# Patient Record
Sex: Male | Born: 1980 | Race: White | Hispanic: No | Marital: Single | Smoking: Current every day smoker
Health system: Southern US, Community
[De-identification: ages and names within clinical notes are randomized; demographics above are authoritative.]

## PROBLEM LIST (undated history)

## (undated) HISTORY — PX: KNEE SURGERY: SHX244

---

## 2014-06-07 ENCOUNTER — Emergency Department (HOSPITAL_COMMUNITY): Payer: 59

## 2014-06-07 ENCOUNTER — Emergency Department (HOSPITAL_COMMUNITY)
Admission: EM | Admit: 2014-06-07 | Discharge: 2014-06-07 | Disposition: A | Discharge status: Home / Self Care | Payer: 59 | Attending: Emergency Medicine | Admitting: Emergency Medicine

## 2014-06-07 ENCOUNTER — Encounter (HOSPITAL_COMMUNITY): Payer: Self-pay | Admitting: Emergency Medicine

## 2014-06-07 DIAGNOSIS — S59919A Unspecified injury of unspecified forearm, initial encounter: Secondary | ICD-10-CM

## 2014-06-07 DIAGNOSIS — S6990XA Unspecified injury of unspecified wrist, hand and finger(s), initial encounter: Secondary | ICD-10-CM | POA: Diagnosis present

## 2014-06-07 DIAGNOSIS — S63509A Unspecified sprain of unspecified wrist, initial encounter: Secondary | ICD-10-CM | POA: Insufficient documentation

## 2014-06-07 DIAGNOSIS — Y9389 Activity, other specified: Secondary | ICD-10-CM | POA: Insufficient documentation

## 2014-06-07 DIAGNOSIS — Y9289 Other specified places as the place of occurrence of the external cause: Secondary | ICD-10-CM | POA: Diagnosis not present

## 2014-06-07 DIAGNOSIS — X500XXA Overexertion from strenuous movement or load, initial encounter: Secondary | ICD-10-CM | POA: Diagnosis not present

## 2014-06-07 DIAGNOSIS — S59909A Unspecified injury of unspecified elbow, initial encounter: Secondary | ICD-10-CM | POA: Diagnosis present

## 2014-06-07 DIAGNOSIS — S63502A Unspecified sprain of left wrist, initial encounter: Secondary | ICD-10-CM

## 2014-06-07 DIAGNOSIS — F17200 Nicotine dependence, unspecified, uncomplicated: Secondary | ICD-10-CM | POA: Insufficient documentation

## 2014-06-07 MED ORDER — NAPROXEN 500 MG PO TABS: 500.0000 mg | ORAL_TABLET | Freq: Two times a day (BID) | ORAL | Status: AC

## 2014-06-07 MED ORDER — IBUPROFEN 400 MG PO TABS
400.0000 mg | ORAL_TABLET | Freq: Once | ORAL | Status: AC
  Administered 2014-06-07: 400 mg via ORAL
  Filled 2014-06-07: qty 1

## 2014-06-07 NOTE — ED Notes (Signed)
Wrist splint applied to left wrist.

## 2014-06-07 NOTE — ED Provider Notes (Signed)
CSN: [id number]     Arrival date & time 06/07/14  1745 History  This chart was scribed for non-physician practitioner, Marlon Pel, PA-C working with Geoffery Lyons, MD by Luisa Dago, ED scribe. This patient was seen in room TR08C/TR08C and the patient's care was started at 7:50 PM.    Chief Complaint  Patient presents with  . Wrist Pain   The history is provided by the patient. No language interpreter was used.   HPI Comments: Antonio Norton is a 33 y.o. male who presents to the Emergency Department complaining of worsening left wrist pain that started 3 weeks ago. Pt states that the pain started after a fall that occurred 3 weeks ago. He states that he tried to catch his fall with the affected hand. Pt states that the pain is exacerbated by movement. Mr.Sepulveda reports making a wood splint for his affected hand and taking OTC medication for the associated pain. Denies any fever, chills, nausea, emesis, abdominal pain, numbness, weakness, tingling, or chest pain.   History reviewed. No pertinent past medical history. Past Surgical History  Procedure Laterality Date  . Knee surgery     No family history on file. History  Substance Use Topics  . Smoking status: Current Every Day Smoker  . Smokeless tobacco: Not on file  . Alcohol Use: Yes    Review of Systems  Constitutional: Negative for fever.  Musculoskeletal: Positive for arthralgias and joint swelling. Negative for myalgias.  Neurological: Negative for weakness and numbness.  All other systems reviewed and are negative.  Allergies  Review of patient's allergies indicates no known allergies.  Home Medications   Prior to Admission medications   Medication Sig Start Date End Date Taking? Authorizing Provider  naproxen (NAPROSYN) 500 MG tablet Take 1 tablet (500 mg total) by mouth 2 (two) times daily. 06/07/14   Dorthula Matas, PA-C    Triage vitals:BP 151/90  Pulse 107  Temp(Src) 97.8 F (36.6 C) (Oral)  Resp 18  Wt  285 lb (129.275 kg)  SpO2 100%  Physical Exam  Nursing note and vitals reviewed. Constitutional: He is oriented to person, place, and time. He appears well-developed and well-nourished. No distress.  HENT:  Head: Normocephalic and atraumatic.  Eyes: Conjunctivae and EOM are normal.  Neck: Normal range of motion. Neck supple.  Cardiovascular: Normal rate.   Pulmonary/Chest: Effort normal. No respiratory distress.  Musculoskeletal: Normal range of motion.       Left wrist: He exhibits tenderness and bony tenderness. He exhibits normal range of motion, no swelling, no effusion, no crepitus, no deformity and no laceration.  Neurological: He is alert and oriented to person, place, and time.  Skin: Skin is warm and dry.  Psychiatric: He has a normal mood and affect. His behavior is normal.    ED Course  Procedures (including critical care time)  DIAGNOSTIC STUDIES: Oxygen Saturation is 100% on RA, normal by my interpretation.    COORDINATION OF CARE: 7:53 PM- Will prescribe antiinflammatories and a wrist splint. Informed pt of the results of his x-ray which is normal. Suspect wrist sprain, PE is not very impressive. Pt advised of plan for treatment and pt agrees.Rerral with Hand/Ortho given.    Imaging Review Dg Wrist Complete Left  06/07/2014   CLINICAL DATA:  Larey Seat 2 weeks ago  EXAM: LEFT WRIST - COMPLETE 3+ VIEW  COMPARISON:  None.  FINDINGS: There is no evidence of fracture or dislocation. There are cystic changes within the ulnar aspect of the  lunate. There are small cystic areas within the capitate. Soft tissues are unremarkable.  IMPRESSION: No acute osseous injury of the left wrist.   Electronically Signed   By: Elige Ko   On: 06/07/2014 19:43    MDM   Final diagnoses:  Wrist sprain, left, initial encounter    33 y.o.Antonio Norton evaluation in the Emergency Department is complete. It has been determined that no acute conditions requiring further emergency intervention are  present at this time. The patient/guardian have been advised of the diagnosis and plan. We have discussed signs and symptoms that warrant return to the ED, such as changes or worsening in symptoms.  Vital signs are stable at discharge. Filed Vitals:   06/07/14 2008  BP: 131/91  Pulse: 56  Temp: 97.8 F (36.6 C)  Resp: 20    Patient/guardian has voiced understanding and agreed to follow-up with the PCP or specialist.   I personally performed the services described in this documentation, which was scribed in my presence. The recorded information has been reviewed and is accurate.    Dorthula Matas, PA-C 06/08/14 0015

## 2014-06-07 NOTE — ED Notes (Signed)
Tiffany, PA-C, at the bedside. 

## 2014-06-07 NOTE — Discharge Instructions (Signed)

## 2014-06-07 NOTE — ED Notes (Signed)
Reports 3 weeks ago fell off porch and caught himself on left wrist.. Since then left wrist pain. Pulse present, sensation intact. Reports swelling started this weekend. Has been applying ice.

## 2014-06-09 NOTE — ED Provider Notes (Signed)
Medical screening examination/treatment/procedure(s) were performed by non-physician practitioner and as supervising physician I was immediately available for consultation/collaboration.     Linkyn Gobin, MD 06/09/14 0124 

## 2015-03-02 IMAGING — CR DG WRIST COMPLETE 3+V*L*
4 series · 4 of 4 positions shown · non-contrast
Comparison: None.

CLINICAL DATA: Fell 2 weeks ago

EXAM:
LEFT WRIST - COMPLETE 3+ VIEW

[x wrist pa left]
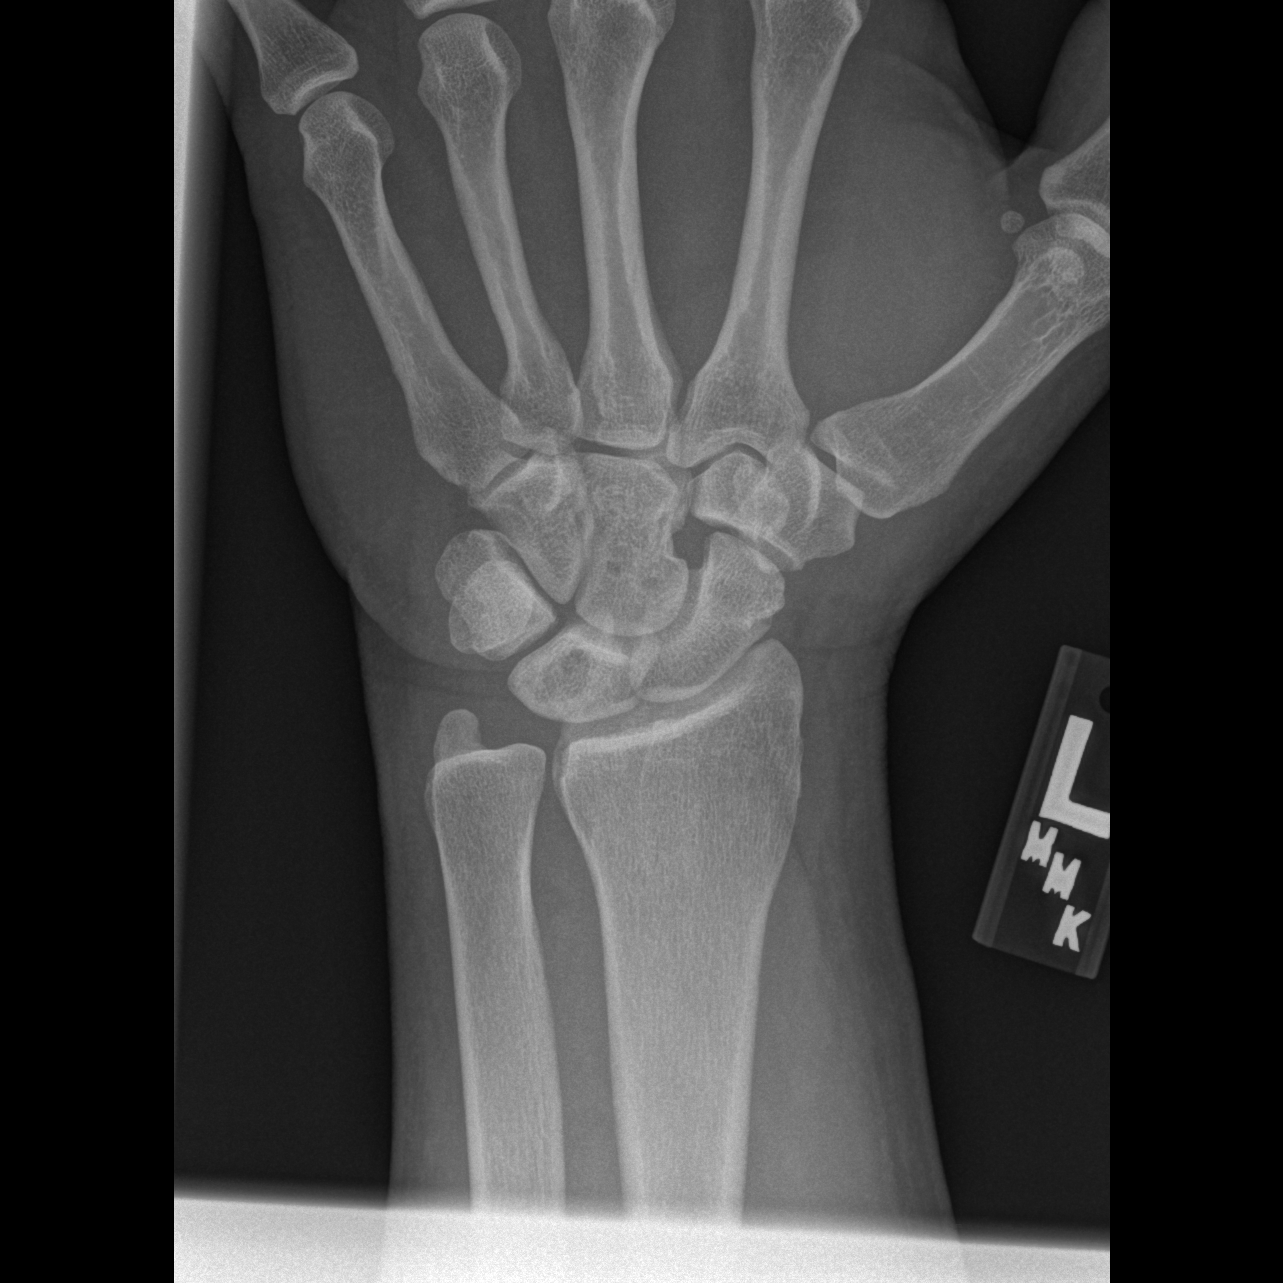

[x navicular]
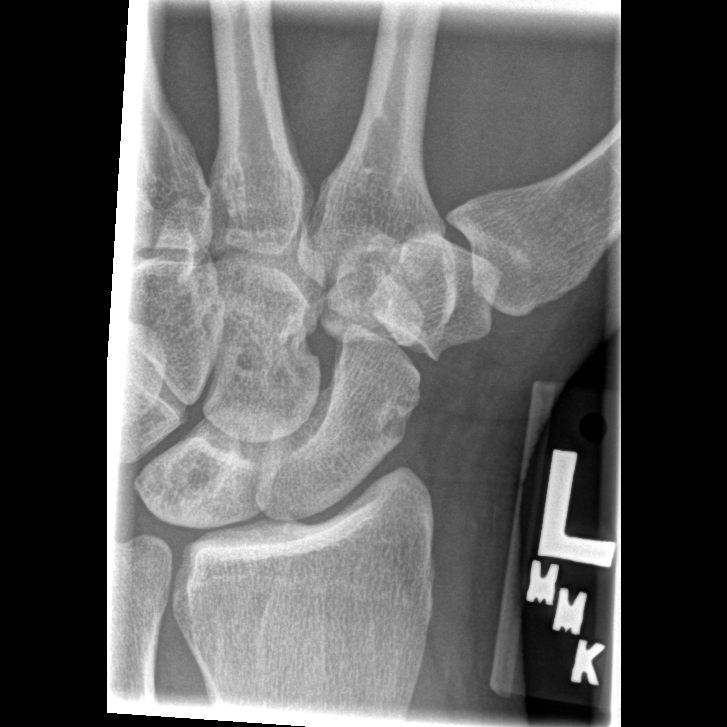

[x wrist obl left]
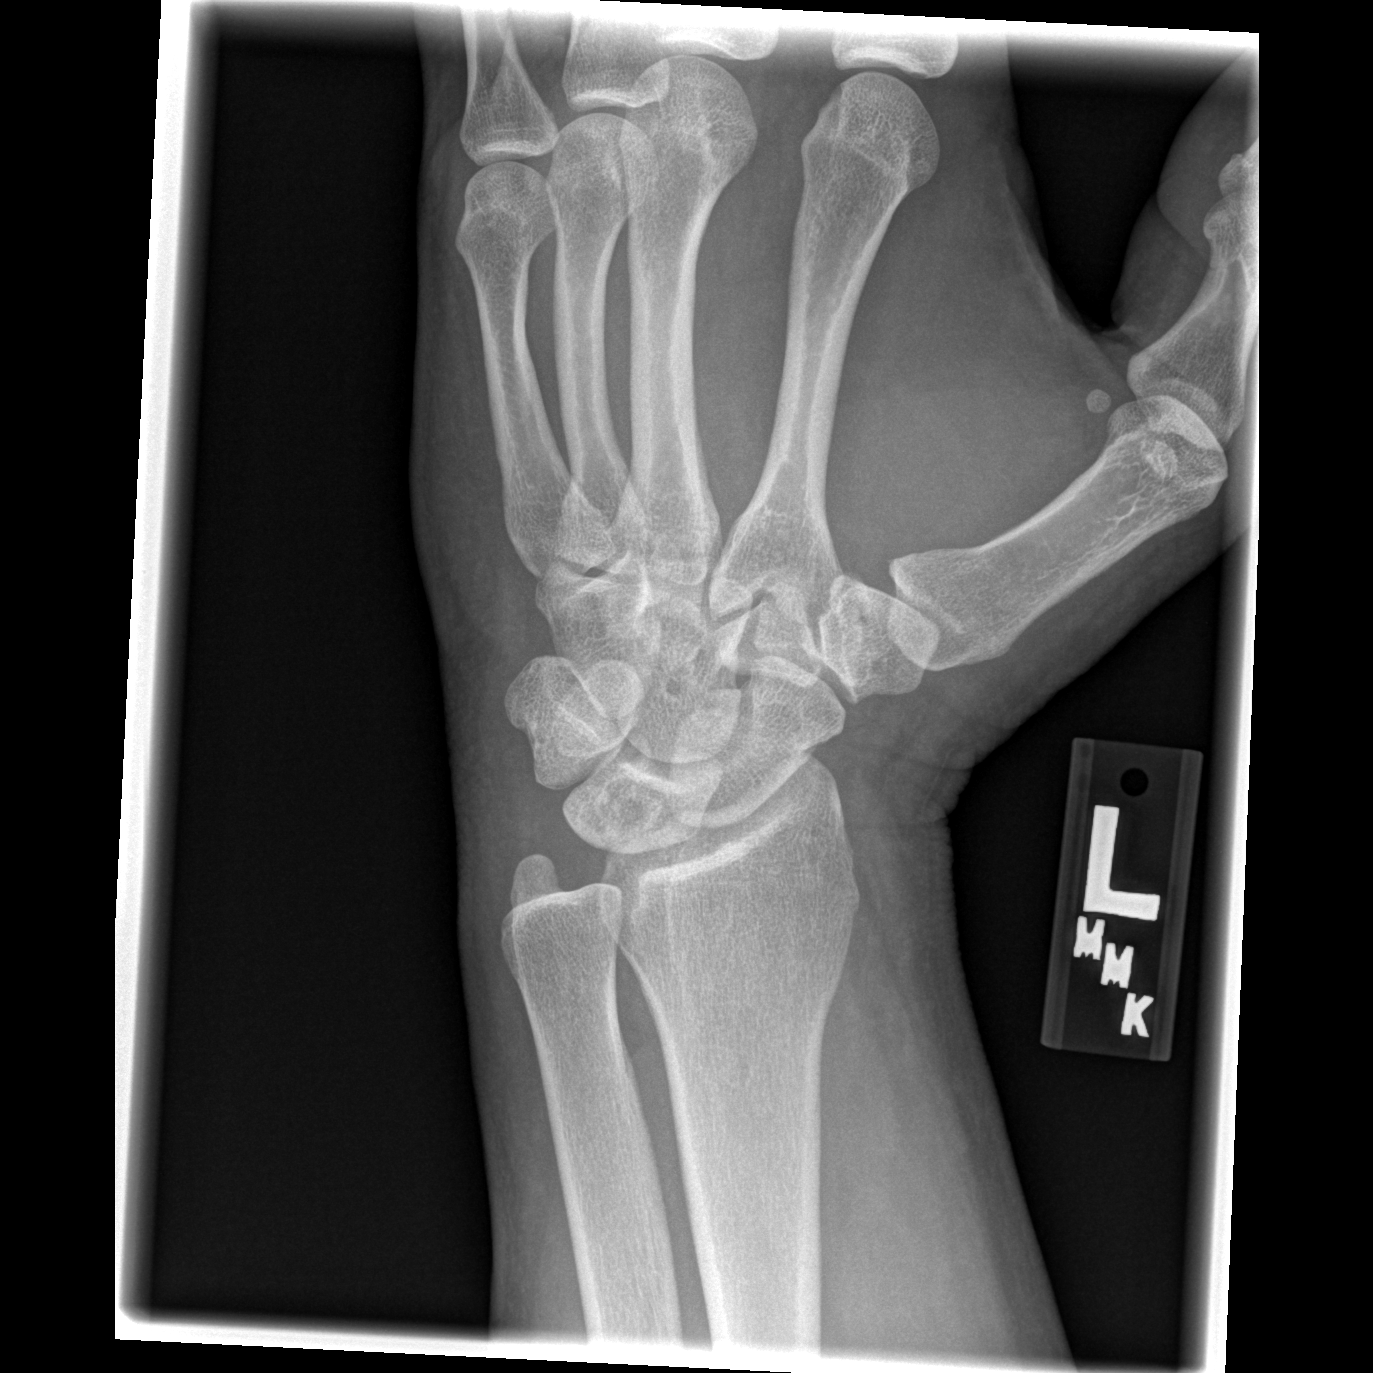

[x wrist lat left]
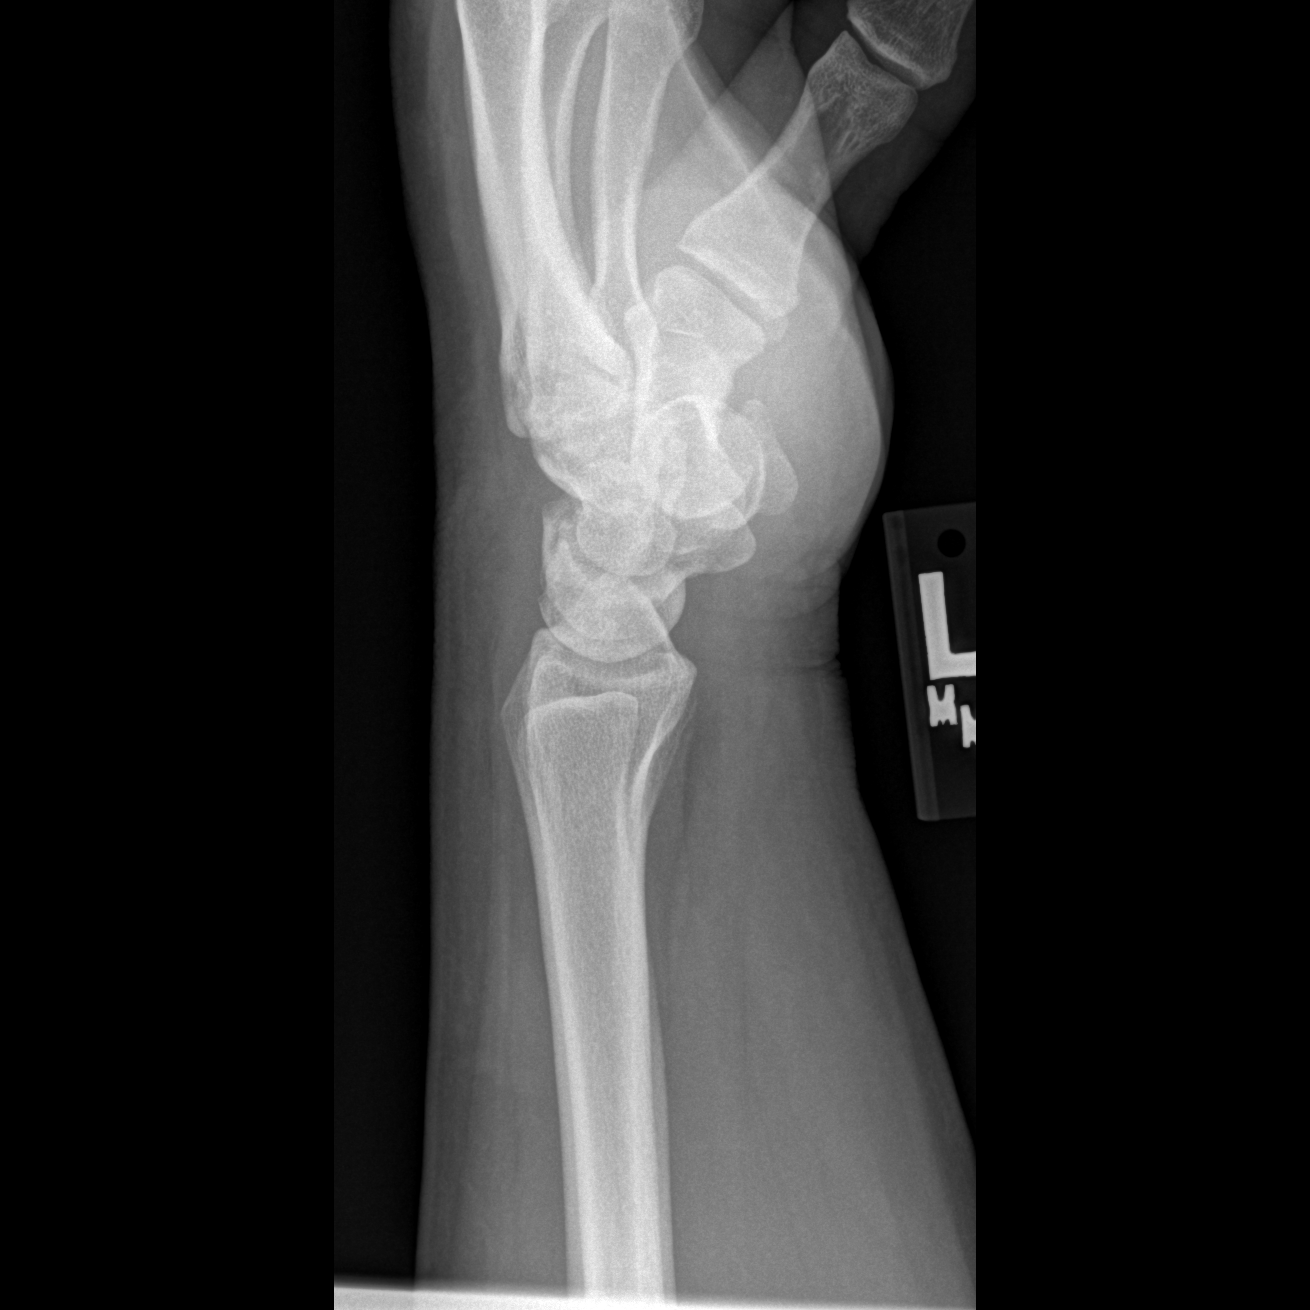

[4 of 4 positions shown; findings below may reference images not displayed]

FINDINGS: There is no evidence of fracture or dislocation. There are cystic
changes within the ulnar aspect of the lunate. There are small
cystic areas within the capitate. Soft tissues are unremarkable.
IMPRESSION: No acute osseous injury of the left wrist.
